# Patient Record
Sex: Male | Born: 1985 | Race: Black or African American | Hispanic: No | Marital: Single | Smoking: Current every day smoker
Health system: Southern US, Community
[De-identification: ages and names within clinical notes are randomized; demographics above are authoritative.]

## PROBLEM LIST (undated history)

## (undated) HISTORY — PX: APPENDECTOMY: SHX54

---

## 2008-09-19 ENCOUNTER — Emergency Department (HOSPITAL_COMMUNITY): Admission: EM | Admit: 2008-09-19 | Discharge: 2008-09-20 | Payer: Self-pay | Admitting: Emergency Medicine

## 2010-08-13 LAB — POCT I-STAT, CHEM 8
BUN: 13 mg/dL (ref 6–23)
Chloride: 103 mEq/L (ref 96–112)
Creatinine, Ser: 1.1 mg/dL (ref 0.4–1.5)
Potassium: 3.6 mEq/L (ref 3.5–5.1)
Sodium: 140 mEq/L (ref 135–145)

## 2010-08-13 LAB — URINALYSIS, ROUTINE W REFLEX MICROSCOPIC
Glucose, UA: NEGATIVE mg/dL
Hgb urine dipstick: NEGATIVE
Specific Gravity, Urine: 1.026 (ref 1.005–1.030)

## 2010-08-18 ENCOUNTER — Emergency Department (HOSPITAL_COMMUNITY)
Admission: EM | Admit: 2010-08-18 | Discharge: 2010-08-18 | Disposition: A | Discharge status: Home / Self Care | Payer: Self-pay | Attending: Emergency Medicine | Admitting: Emergency Medicine

## 2010-08-18 DIAGNOSIS — H9209 Otalgia, unspecified ear: Secondary | ICD-10-CM | POA: Insufficient documentation

## 2010-08-18 DIAGNOSIS — H6690 Otitis media, unspecified, unspecified ear: Secondary | ICD-10-CM | POA: Insufficient documentation

## 2010-10-23 IMAGING — CR DG CHEST 2V
2 series · 2 of 2 positions shown · non-contrast
Comparison: None

CLINICAL DATA: Chest pain

CHEST - 2 VIEW

[w chest pa]
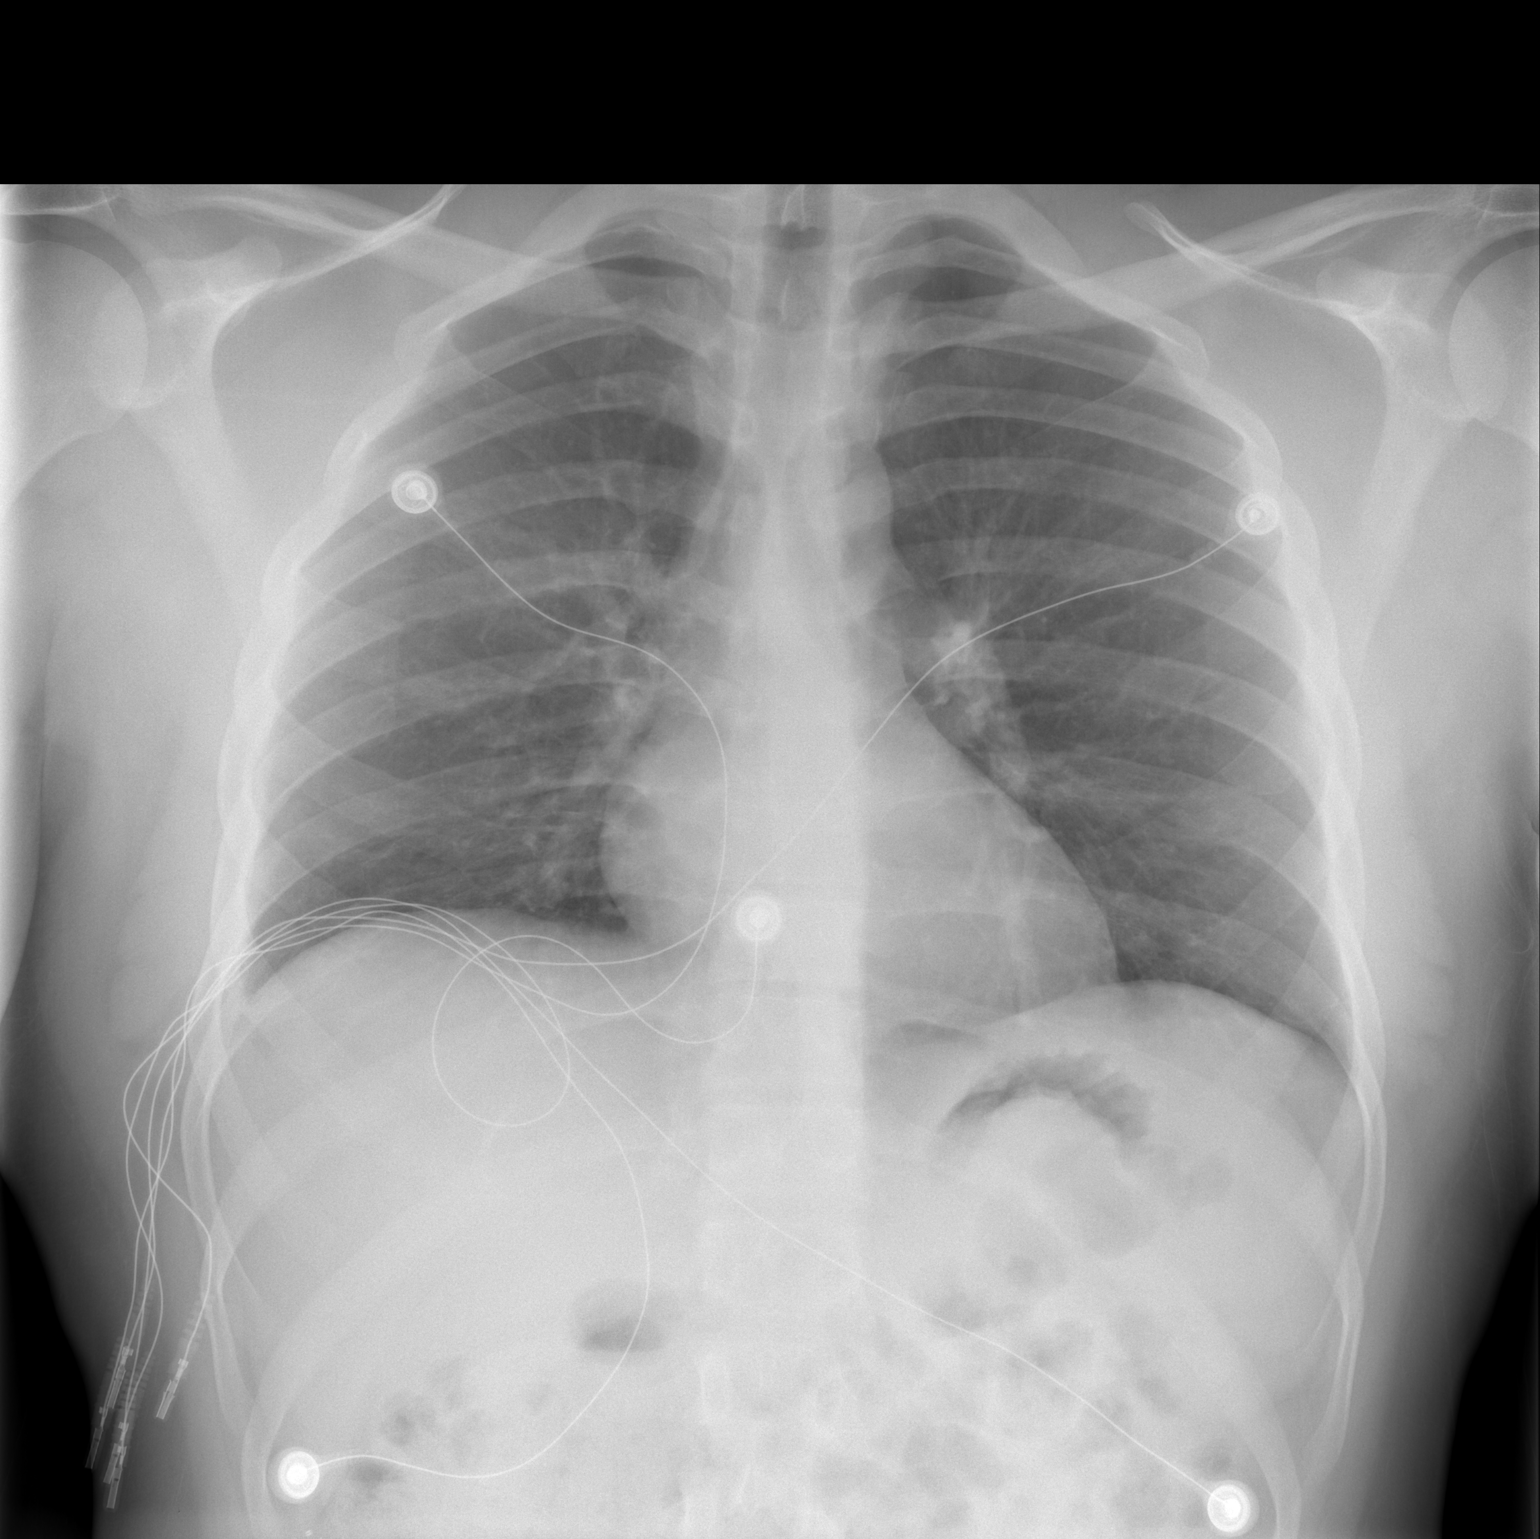

[w chest lat]
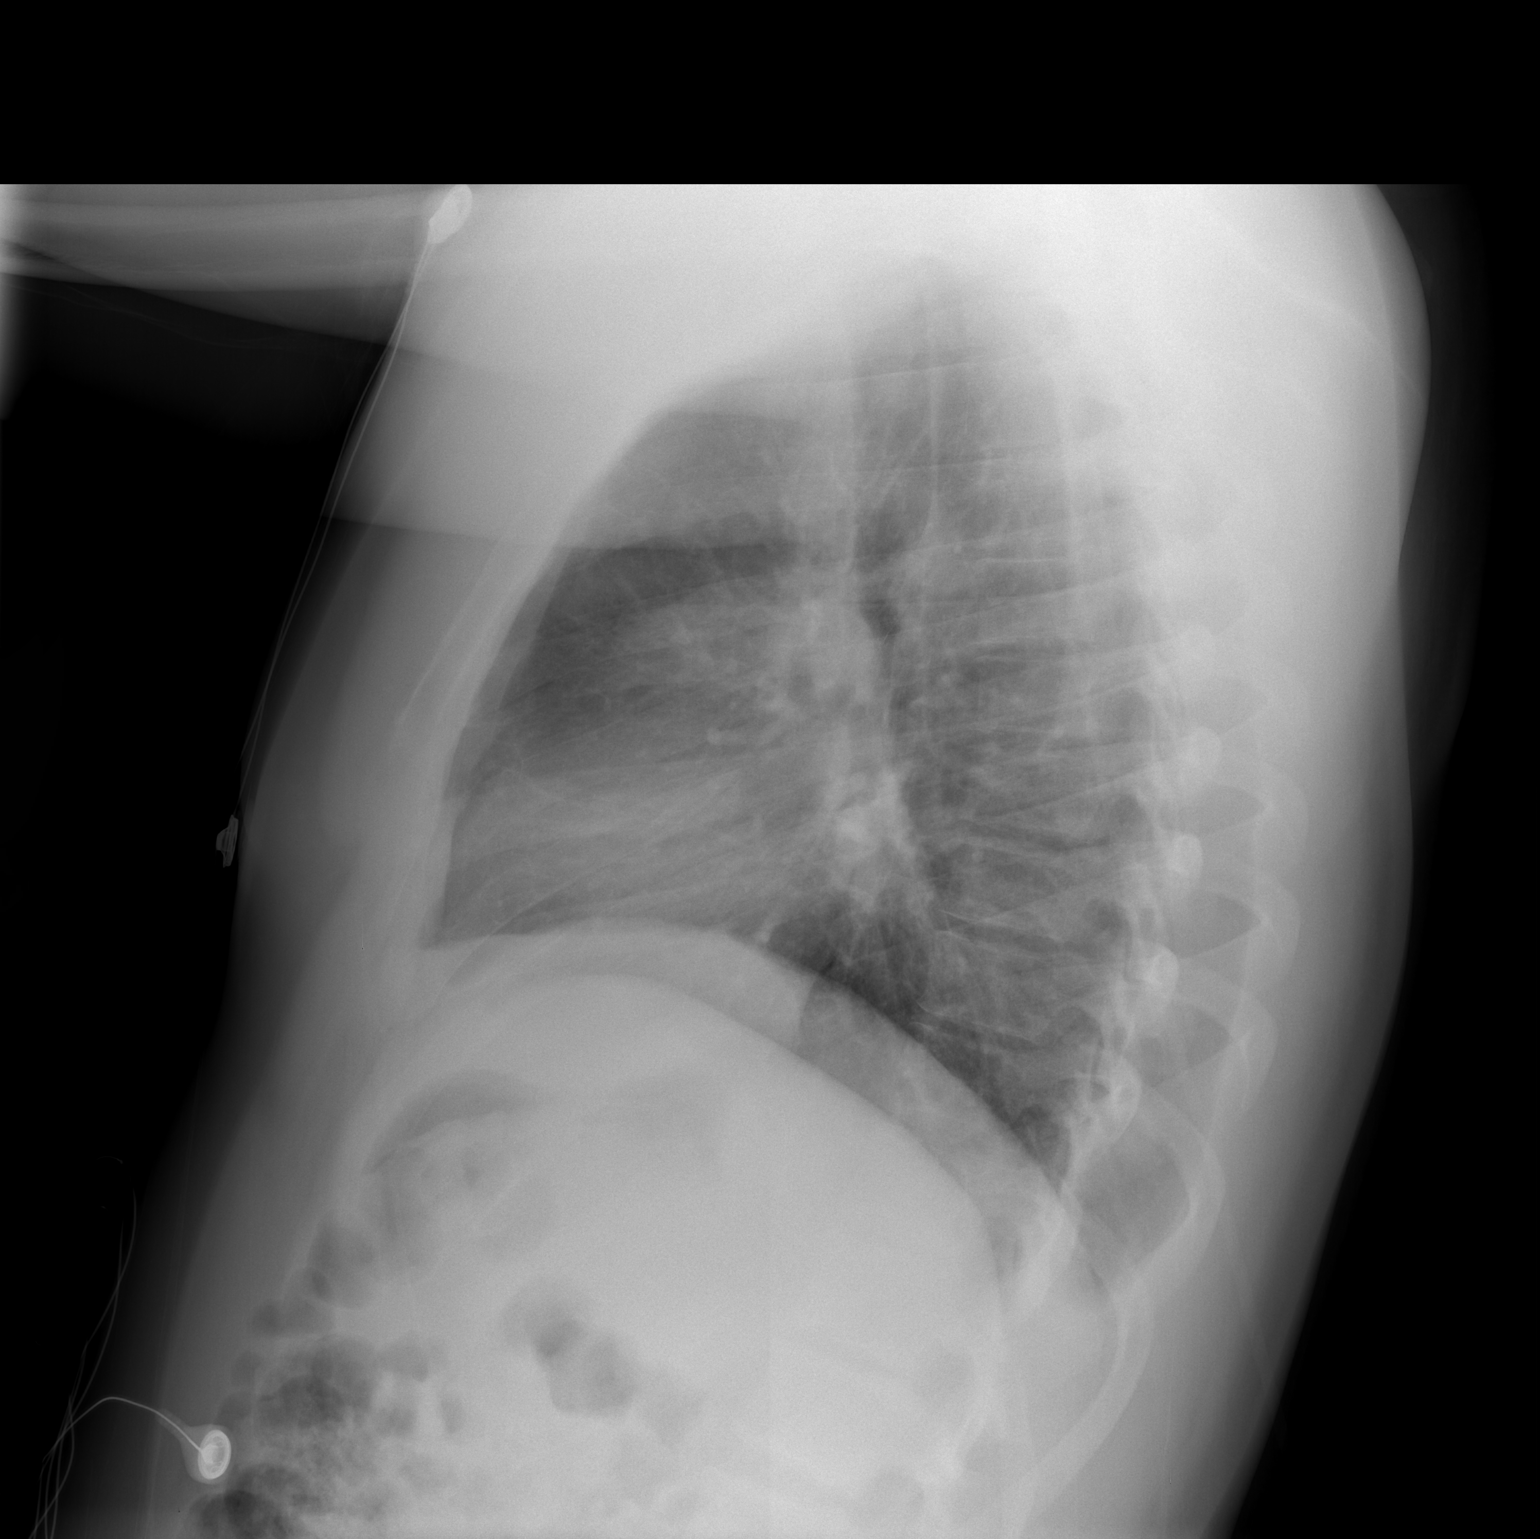

[2 of 2 positions shown; findings below may reference images not displayed]

FINDINGS: Minimal atelectasis present at both lung bases.  No edema
or infiltrates.  The heart size is normal.  No pleural effusions.
IMPRESSION: No active disease.  Minimal bibasilar atelectasis.

## 2015-07-25 ENCOUNTER — Emergency Department (HOSPITAL_COMMUNITY)
Admission: EM | Admit: 2015-07-25 | Discharge: 2015-07-25 | Disposition: A | Discharge status: Home / Self Care | Payer: Self-pay | Attending: Emergency Medicine | Admitting: Emergency Medicine

## 2015-07-25 ENCOUNTER — Encounter (HOSPITAL_COMMUNITY): Payer: Self-pay | Admitting: Emergency Medicine

## 2015-07-25 DIAGNOSIS — F17200 Nicotine dependence, unspecified, uncomplicated: Secondary | ICD-10-CM | POA: Insufficient documentation

## 2015-07-25 DIAGNOSIS — M109 Gout, unspecified: Secondary | ICD-10-CM

## 2015-07-25 DIAGNOSIS — M10072 Idiopathic gout, left ankle and foot: Secondary | ICD-10-CM | POA: Insufficient documentation

## 2015-07-25 MED ORDER — PREDNISONE 10 MG PO TABS: 20.0000 mg | ORAL_TABLET | Freq: Every day | ORAL | Status: AC

## 2015-07-25 MED ORDER — PREDNISONE 20 MG PO TABS
60.0000 mg | ORAL_TABLET | Freq: Once | ORAL | Status: AC
  Administered 2015-07-25: 60 mg via ORAL
  Filled 2015-07-25: qty 3

## 2015-07-25 MED ORDER — TRAMADOL HCL 50 MG PO TABS: 50.0000 mg | ORAL_TABLET | Freq: Once | ORAL | Status: AC

## 2015-07-25 MED ORDER — TRAMADOL HCL 50 MG PO TABS
50.0000 mg | ORAL_TABLET | Freq: Once | ORAL | Status: AC
  Administered 2015-07-25: 50 mg via ORAL
  Filled 2015-07-25: qty 1

## 2015-07-25 NOTE — Discharge Instructions (Signed)
Gout °Gout is when your joints become red, sore, and swell (inflamed). This is caused by the buildup of uric acid crystals in the joints. Uric acid is a chemical that is normally in the blood. If the level of uric acid gets too high in the blood, these crystals form in your joints and tissues. Over time, these crystals can form into masses near the joints and tissues. These masses can destroy bone and cause the bone to look misshapen (deformed). °HOME CARE  °· Do not take aspirin for pain. °· Only take medicine as told by your doctor. °· Rest the joint as much as you can. When in bed, keep sheets and blankets off painful areas. °· Keep the sore joints raised (elevated). °· Put warm or cold packs on painful joints. Use of warm or cold packs depends on which works best for you. °· Use crutches if the painful joint is in your leg. °· Drink enough fluids to keep your pee (urine) clear or pale yellow. Limit alcohol, sugary drinks, and drinks with fructose in them. °· Follow your diet instructions. Pay careful attention to how much protein you eat. Include fruits, vegetables, whole grains, and fat-free or low-fat milk products in your daily diet. Talk to your doctor or dietitian about the use of coffee, vitamin C, and cherries. These may help lower uric acid levels. °· Keep a healthy body weight. °GET HELP RIGHT AWAY IF:  °· You have watery poop (diarrhea), throw up (vomit), or have any side effects from medicines. °· You do not feel better in 24 hours, or you are getting worse. °· Your joint becomes suddenly more tender, and you have chills or a fever. °MAKE SURE YOU:  °· Understand these instructions. °· Will watch your condition. °· Will get help right away if you are not doing well or get worse. °  °This information is not intended to replace advice given to you by your health care provider. Make sure you discuss any questions you have with your health care provider. °  °Document Released: 01/29/2008 Document Revised:  05/12/2014 Document Reviewed: 12/03/2011 °Elsevier Interactive Patient Education ©2016 [employer] ° °

## 2015-07-25 NOTE — ED Notes (Signed)
Pt states that his L foot has hurt since this morning and feels stiff. Familial hx of gout but no personal hx. Denies injury. Alert and oriented.

## 2015-07-25 NOTE — ED Provider Notes (Signed)
CSN: [id number]     Arrival date & time 07/25/15  2056 History  By signing my name below, I, Rodney Reilly, attest that this documentation has been prepared under the direction and in the presence of Arman FilterGail K. Rilan Eiland, NP. Electronically Signed: Budd PalmerVanessa Reilly, ED Scribe. 07/25/2015. 10:13 PM.    Chief Complaint  Patient presents with  . Foot Pain   The history is provided by the patient. No language interpreter was used.   HPI Comments: Stevenson ClinchMarcus Bones is a 30 y.o. male smoker who presents to the Emergency Department complaining of left great toe pain onset this morning. He reports associated erythema and swelling. He notes exacerbation of the pain with movement of the toe. He has not taken anything for this. He reports a FHx of gout, but denies a personal hx.    History reviewed. No pertinent past medical history. Past Surgical History  Procedure Laterality Date  . Appendectomy     History reviewed. No pertinent family history. Social History  Substance Use Topics  . Smoking status: Current Every Day Smoker  . Smokeless tobacco: None  . Alcohol Use: No    Review of Systems  Constitutional: Negative for fever.  Musculoskeletal: Positive for joint swelling and arthralgias.  Skin: Positive for color change.  All other systems reviewed and are negative.   Allergies  Morphine and related and Sulfa antibiotics  Home Medications   Prior to Admission medications   Medication Sig Start Date End Date Taking? Authorizing Provider  predniSONE (DELTASONE) 10 MG tablet Take 2 tablets (20 mg total) by mouth daily. 07/25/15   Earley FavorGail Valin Massie, NP  traMADol (ULTRAM) 50 MG tablet Take 1 tablet (50 mg total) by mouth once. 07/25/15   Earley FavorGail Pharaoh Pio, NP   BP 159/100 mmHg  Pulse 108  Temp(Src) 98.2 F (36.8 C) (Oral)  Resp 18  SpO2 99% Physical Exam  Constitutional: He is oriented to person, place, and time. He appears well-developed and well-nourished.  HENT:  Head: Normocephalic and atraumatic.   Eyes: Conjunctivae are normal. Right eye exhibits no discharge. Left eye exhibits no discharge.  Pulmonary/Chest: Effort normal. No respiratory distress.  Musculoskeletal: He exhibits tenderness. He exhibits no edema.  Redness and swelling to the MCP joint of the left great toe  Neurological: He is alert and oriented to person, place, and time. Coordination normal.  Skin: Skin is warm and dry. No rash noted. He is not diaphoretic. There is erythema.  Psychiatric: He has a normal mood and affect.  Nursing note and vitals reviewed.   ED Course  Procedures  DIAGNOSTIC STUDIES: Oxygen Saturation is 99% on RA, normal by my interpretation.    COORDINATION OF CARE: 10:04 PM - Discussed probable gout and plans to order steroids and pain medication. Pt advised of plan for treatment and pt agrees.  Labs Review Labs Reviewed - No data to display  Imaging Review No results found. I have personally reviewed and evaluated these images and lab results as part of my medical decision-making.   EKG Interpretation None     Will Rx Prednisone and Ultram  MDM   Final diagnoses:  Acute gout of left foot, unspecified cause    I personally performed the services described in this documentation, which was scribed in my presence. The recorded information has been reviewed and is accurate.  Earley FavorGail Verania Salberg, NP 07/25/15 2221  Melene Planan Floyd, DO 07/26/15 [phone]

## 2023-02-08 ENCOUNTER — Emergency Department (HOSPITAL_COMMUNITY): Payer: Self-pay

## 2023-02-08 ENCOUNTER — Other Ambulatory Visit: Payer: Self-pay

## 2023-02-08 ENCOUNTER — Emergency Department (HOSPITAL_COMMUNITY)
Admission: EM | Admit: 2023-02-08 | Discharge: 2023-02-09 | Disposition: A | Discharge status: Home / Self Care | Payer: Self-pay | Attending: Emergency Medicine | Admitting: Emergency Medicine

## 2023-02-08 ENCOUNTER — Encounter (HOSPITAL_COMMUNITY): Payer: Self-pay | Admitting: *Deleted

## 2023-02-08 DIAGNOSIS — Z1152 Encounter for screening for COVID-19: Secondary | ICD-10-CM | POA: Insufficient documentation

## 2023-02-08 DIAGNOSIS — R0789 Other chest pain: Secondary | ICD-10-CM

## 2023-02-08 DIAGNOSIS — J189 Pneumonia, unspecified organism: Secondary | ICD-10-CM | POA: Insufficient documentation

## 2023-02-08 DIAGNOSIS — Z7289 Other problems related to lifestyle: Secondary | ICD-10-CM

## 2023-02-08 LAB — CBC
HCT: 46.9 % (ref 39.0–52.0)
Hemoglobin: 15 g/dL (ref 13.0–17.0)
MCH: 25.7 pg — ABNORMAL LOW (ref 26.0–34.0)
MCHC: 32 g/dL (ref 30.0–36.0)
MCV: 80.3 fL (ref 80.0–100.0)
Platelets: 320 10*3/uL (ref 150–400)
RBC: 5.84 MIL/uL — ABNORMAL HIGH (ref 4.22–5.81)
RDW: 13.6 % (ref 11.5–15.5)
WBC: 13.9 10*3/uL — ABNORMAL HIGH (ref 4.0–10.5)
nRBC: 0 % (ref 0.0–0.2)

## 2023-02-08 NOTE — ED Triage Notes (Signed)
The pt is c/o chest pain and sob for 2-3 days  no temp  no previous history

## 2023-02-09 ENCOUNTER — Other Ambulatory Visit (HOSPITAL_COMMUNITY): Payer: Self-pay

## 2023-02-09 LAB — BASIC METABOLIC PANEL
Anion gap: 13 (ref 5–15)
BUN: 16 mg/dL (ref 6–20)
CO2: 26 mmol/L (ref 22–32)
Calcium: 8.7 mg/dL — ABNORMAL LOW (ref 8.9–10.3)
Chloride: 99 mmol/L (ref 98–111)
Creatinine, Ser: 1.28 mg/dL — ABNORMAL HIGH (ref 0.61–1.24)
GFR, Estimated: >60 mL/min (ref >60)
Glucose, Bld: 89 mg/dL (ref 70–99)
Potassium: 3.9 mmol/L (ref 3.5–5.1)
Sodium: 138 mmol/L (ref 135–145)

## 2023-02-09 LAB — TROPONIN I (HIGH SENSITIVITY)
Troponin I (High Sensitivity): 8 ng/L (ref <18)
Troponin I (High Sensitivity): 8 ng/L (ref <18)

## 2023-02-09 LAB — SARS CORONAVIRUS 2 BY RT PCR: SARS Coronavirus 2 by RT PCR: NEGATIVE

## 2023-02-09 MED ORDER — AZITHROMYCIN 250 MG PO TABS
250.0000 mg | ORAL_TABLET | Freq: Every day | ORAL | 0 refills | Status: AC
  Filled 2023-02-09: qty 6, 5d supply, fill #0

## 2023-02-09 MED ORDER — NAPROXEN 250 MG PO TABS
500.0000 mg | ORAL_TABLET | Freq: Once | ORAL | Status: AC
  Administered 2023-02-09: 500 mg via ORAL
  Filled 2023-02-09: qty 2

## 2023-02-09 MED ORDER — ALBUTEROL SULFATE HFA 108 (90 BASE) MCG/ACT IN AERS
1.0000 | INHALATION_SPRAY | Freq: Four times a day (QID) | RESPIRATORY_TRACT | 0 refills | Status: AC | PRN
  Filled 2023-02-09: qty 6.7, 25d supply, fill #0

## 2023-02-09 MED ORDER — NAPROXEN 500 MG PO TABS
500.0000 mg | ORAL_TABLET | Freq: Two times a day (BID) | ORAL | 0 refills | Status: AC
  Filled 2023-02-09: qty 10, 5d supply, fill #0

## 2023-02-09 NOTE — Discharge Instructions (Signed)
We suspect right now that you are possibly having walking pneumonia and musculoskeletal pain. Your cardiac workup is normal. As discussed, we have low suspicion for blood clots in the lungs, but if your symptoms of chest pain, shortness of breath get worse or you start having near fainting, fainting, bloody phlegm then please return to the ER for further reassessment.  Please discontinue vaping as soon as you can.

## 2023-02-09 NOTE — ED Notes (Signed)
TOC meds given to patient

## 2023-02-09 NOTE — ED Provider Notes (Addendum)
[place] EMERGENCY DEPARTMENT AT [facility] Provider Note   CSN: [id number] Arrival date & time: 02/08/23  2238     History  Chief Complaint  Patient presents with   Chest Pain    Rodney Reilly is a 37 y.o. male.  HPI    37 y/o M comes in with cc of chest pain. Chest pain started 3 days ago, it is center and left and also in his back. Pain is constant since yesterday and it is pressure like and sharp, and no hx of similar pain. Pain worse with walking, but also with pushing and pulling but there is no injury. Pt also has cough and shob, pain worse with cough and deep inspiration. Cough x 3 days, sometimes producing. He didn't think he had fever, but febrile at arrival.  Pt has no hx of PE, DVT and denies any exogenous hormone (testosterone / estrogen) use, long distance travels or surgery in the past 6 weeks, active cancer, recent immobilization.  Family hx - + CAD, but at old age. No clotting hx. Social hx - Vapes all day   Home Medications Prior to Admission medications   Medication Sig Start Date End Date Taking? Authorizing Provider  predniSONE (DELTASONE) 10 MG tablet Take 2 tablets (20 mg total) by mouth daily. 07/25/15   Earley Favor, NP  traMADol (ULTRAM) 50 MG tablet Take 1 tablet (50 mg total) by mouth once. 07/25/15   Earley Favor, NP      Allergies    Morphine and codeine and Sulfa antibiotics    Review of Systems   Review of Systems  All other systems reviewed and are negative.   Physical Exam Updated Vital Signs BP 119/67   Pulse 68   Temp 98.1 F (36.7 C) (Oral)   Resp (!) 22   Ht 5\' 11"  (1.803 m)   Wt 127 kg   SpO2 100%   BMI 39.05 kg/m  Physical Exam Vitals and nursing note reviewed.  Constitutional:      Appearance: He is well-developed.  HENT:     Head: Atraumatic.  Cardiovascular:     Rate and Rhythm: Normal rate.     Heart sounds: Normal heart sounds.  Pulmonary:     Effort: Pulmonary effort is normal.  Musculoskeletal:      Cervical back: Neck supple.     Comments: Patient does have reproducible component to the chest pain in the back region  Skin:    General: Skin is warm.  Neurological:     Mental Status: He is alert and oriented to person, place, and time.     ED Results / Procedures / Treatments   Labs (all labs ordered are listed, but only abnormal results are displayed) Labs Reviewed  BASIC METABOLIC PANEL - Abnormal; Notable for the following components:      Result Value   Creatinine, Ser 1.28 (*)    Calcium 8.7 (*)    All other components within normal limits  CBC - Abnormal; Notable for the following components:   WBC 13.9 (*)    RBC 5.84 (*)    MCH 25.7 (*)    All other components within normal limits  SARS CORONAVIRUS 2 BY RT PCR  TROPONIN I (HIGH SENSITIVITY)  TROPONIN I (HIGH SENSITIVITY)    EKG EKG Interpretation Date/Time:  Sunday February 08 2023 22:52:23 EDT Ventricular Rate:  100 PR Interval:  124 QRS Duration:  74 QT Interval:  332 QTC Calculation: 428 R Axis:  54  Text Interpretation: Normal sinus rhythm Normal ECG When compared with ECG of 19-Sep-2008 23:59, PREVIOUS ECG IS PRESENT No acute changes No significant change since last tracing Confirmed by Derwood Kaplan [phone]) on 02/09/2023 9:48:12 AM  Radiology DG Chest 2 View  Result Date: 02/09/2023 CLINICAL DATA:  Chest pain for 2-3 days with shortness of breath. Pain worse with movement EXAM: CHEST - 2 VIEW COMPARISON:  09/20/2008 FINDINGS: The heart size and mediastinal contours are within normal limits. Bibasilar atelectasis/scarring. Otherwise no focal consolidation, pleural effusion, or pneumothorax. No displaced rib fractures. IMPRESSION: No active cardiopulmonary disease. Electronically Signed   By: Minerva Fester M.D.   On: 02/09/2023 01:02    Procedures Procedures    Medications Ordered in ED Medications - No data to display  ED Course/ Medical Decision Making/ A&P             HEART Score: 1                     Medical Decision Making  This patient presents to the ED with chief complaint(s) of left-sided chest pain, back pain, cough and he was noted to have fevers with pertinent past medical history of heavy vaping.The complaint involves an extensive differential diagnosis and also carries with it a high risk of complications and morbidity.    The differential diagnosis includes : Pulmonary embolism, acute coronary syndrome, pneumonia, pleurisy, chest wall pain, musculoskeletal pain  The initial plan is to get delta troponin.  Patient is PERC negative, he does not have any PE risk factors, no signs of DVT and patient amenable to returning to the ER if his symptoms get worse.    Independent labs interpretation:  The following labs were independently interpreted: Patient's delta troponins are normal.  Patient has mild leukocytosis and was febrile at arrival. No hypoxia, no tachycardia in the ED.  Independent visualization and interpretation of imaging: - I independently visualized the following imaging with scope of interpretation limited to determining acute life threatening conditions related to emergency care: X-ray of the chest, which revealed no evidence of focal consolidation, pleural effusion, pulmonary edema.  Treatment and Reassessment: Results of the ED workup discussed with the patient. Discussed with him that 1 consideration was PE, but he has no risk factors for it and using decision-making tool it is highly unlikely that he has PE.  He is comfortable returning to the ER if his symptoms are not improving despite medications we prescribed.  He does have reproducible chest wall discomfort, which could explain why there is a pleuritic component to the pain.  He feels like there is some tightness to his chest and he was febrile with elevated white count, will treat this like a walking pneumonia.  Delta troponin are normal.  Low concerns clinically for ACS as the cause.  No  indication for cardiology follow-up at this time.  TOC consulted for help with medications.   Social Determinants of health: Lack of insurance.   Final Clinical Impression(s) / ED Diagnoses Final diagnoses:  Community acquired pneumonia of left lung, unspecified part of lung  Atypical chest pain    Rx / DC Orders ED Discharge Orders     None         Derwood Kaplan, MD 02/09/23 1033    Derwood Kaplan, MD 02/09/23 1034

## 2023-10-19 ENCOUNTER — Other Ambulatory Visit: Payer: Self-pay

## 2024-05-30 ENCOUNTER — Emergency Department (HOSPITAL_COMMUNITY): Payer: Self-pay

## 2024-05-30 ENCOUNTER — Encounter (HOSPITAL_COMMUNITY): Payer: Self-pay

## 2024-05-30 ENCOUNTER — Emergency Department (HOSPITAL_COMMUNITY)
Admission: EM | Admit: 2024-05-30 | Discharge: 2024-05-30 | Disposition: A | Discharge status: Home / Self Care | Payer: Self-pay

## 2024-05-30 ENCOUNTER — Other Ambulatory Visit: Payer: Self-pay

## 2024-05-30 DIAGNOSIS — M546 Pain in thoracic spine: Secondary | ICD-10-CM | POA: Insufficient documentation

## 2024-05-30 DIAGNOSIS — R0789 Other chest pain: Secondary | ICD-10-CM | POA: Insufficient documentation

## 2024-05-30 DIAGNOSIS — D72829 Elevated white blood cell count, unspecified: Secondary | ICD-10-CM | POA: Insufficient documentation

## 2024-05-30 DIAGNOSIS — R079 Chest pain, unspecified: Secondary | ICD-10-CM

## 2024-05-30 DIAGNOSIS — R000 Tachycardia, unspecified: Secondary | ICD-10-CM | POA: Insufficient documentation

## 2024-05-30 LAB — D-DIMER, QUANTITATIVE: D-Dimer, Quant: <0.27 ug{FEU}/mL (ref 0.00–0.50)

## 2024-05-30 LAB — BASIC METABOLIC PANEL WITH GFR
Anion gap: 11 (ref 5–15)
BUN: 12 mg/dL (ref 6–20)
CO2: 26 mmol/L (ref 22–32)
Calcium: 9.6 mg/dL (ref 8.9–10.3)
Chloride: 102 mmol/L (ref 98–111)
Creatinine, Ser: 1.19 mg/dL (ref 0.61–1.24)
GFR, Estimated: >60 mL/min
Glucose, Bld: 100 mg/dL — ABNORMAL HIGH (ref 70–99)
Potassium: 4.8 mmol/L (ref 3.5–5.1)
Sodium: 140 mmol/L (ref 135–145)

## 2024-05-30 LAB — CBC WITH DIFFERENTIAL/PLATELET
Abs Immature Granulocytes: 0.02 10*3/uL (ref 0.00–0.07)
Basophils Absolute: 0 10*3/uL (ref 0.0–0.1)
Basophils Relative: 0 %
Eosinophils Absolute: 0.1 10*3/uL (ref 0.0–0.5)
Eosinophils Relative: 1 %
HCT: 48.5 % (ref 39.0–52.0)
Hemoglobin: 15.7 g/dL (ref 13.0–17.0)
Immature Granulocytes: 0 %
Lymphocytes Relative: 20 %
Lymphs Abs: 2.2 10*3/uL (ref 0.7–4.0)
MCH: 26.1 pg (ref 26.0–34.0)
MCHC: 32.4 g/dL (ref 30.0–36.0)
MCV: 80.6 fL (ref 80.0–100.0)
Monocytes Absolute: 0.8 10*3/uL (ref 0.1–1.0)
Monocytes Relative: 7 %
Neutro Abs: 7.7 10*3/uL (ref 1.7–7.7)
Neutrophils Relative %: 72 %
Platelets: 296 10*3/uL (ref 150–400)
RBC: 6.02 MIL/uL — ABNORMAL HIGH (ref 4.22–5.81)
RDW: 13.2 % (ref 11.5–15.5)
WBC: 10.7 10*3/uL — ABNORMAL HIGH (ref 4.0–10.5)
nRBC: 0 % (ref 0.0–0.2)

## 2024-05-30 LAB — TROPONIN T, HIGH SENSITIVITY: Troponin T High Sensitivity: <6 ng/L (ref 0–19)

## 2024-05-30 MED ORDER — KETOROLAC TROMETHAMINE 15 MG/ML IJ SOLN
15.0000 mg | Freq: Once | INTRAMUSCULAR | Status: DC
  Filled 2024-05-30: qty 1

## 2024-05-30 MED ORDER — KETOROLAC TROMETHAMINE 30 MG/ML IJ SOLN
15.0000 mg | Freq: Once | INTRAMUSCULAR | Status: AC
  Administered 2024-05-30: 15 mg via INTRAVENOUS
  Filled 2024-05-30: qty 1

## 2024-05-30 NOTE — ED Notes (Signed)
Refuses EKG 

## 2024-05-30 NOTE — ED Provider Notes (Signed)
 "  EMERGENCY DEPARTMENT AT [facility] Provider Note   CSN: [id number] Arrival date & time: 05/30/24  1506     Patient presents with: Back Pain   Rodney Reilly is a 39 y.o. male.  Presents today for upper back and chest discomfort x 2 days.  Patient is a marine scientist.  Patient reports that the pain is worse with inspiration and movement.  Patient denies fever, chills, nausea, vomiting, cough, congestion, any other complaints at this time.    Back Pain      Prior to Admission medications  Medication Sig Start Date End Date Taking? Authorizing Provider  albuterol  (VENTOLIN  HFA) 108 (90 Base) MCG/ACT inhaler Inhale 1-2 puffs into the lungs every 6 (six) hours as needed for wheezing or shortness of breath. 02/09/23   Charlyn Sora, MD  azithromycin  (ZITHROMAX ) 250 MG tablet Take first 2 tablets together on day 1, then 1 every day until finished. 02/09/23   Charlyn Sora, MD  naproxen  (NAPROSYN ) 500 MG tablet Take 1 tablet (500 mg total) by mouth 2 (two) times daily. 02/09/23   Charlyn Sora, MD  predniSONE  (DELTASONE ) 10 MG tablet Take 2 tablets (20 mg total) by mouth daily. 07/25/15   Terryl Kubas, NP  traMADol  (ULTRAM ) 50 MG tablet Take 1 tablet (50 mg total) by mouth once. 07/25/15   Terryl Kubas, NP    Allergies: Morphine and codeine and Sulfa antibiotics    Review of Systems  Respiratory:  Positive for chest tightness and shortness of breath.   Musculoskeletal:  Positive for back pain.    Updated Vital Signs BP (!) 146/83 (BP Location: Left Arm)   Pulse 98   Temp 98.1 F (36.7 C) (Oral)   Resp 18   SpO2 97%   Physical Exam Vitals and nursing note reviewed.  Constitutional:      General: He is not in acute distress.    Appearance: He is well-developed. He is not toxic-appearing.  HENT:     Head: Normocephalic and atraumatic.     Right Ear: External ear normal.     Left Ear: External ear normal.  Eyes:     Conjunctiva/sclera:  Conjunctivae normal.  Cardiovascular:     Rate and Rhythm: Normal rate and regular rhythm.     Pulses: Normal pulses.     Heart sounds: Normal heart sounds. No murmur heard.    Comments: Patient borderline tachycardic Pulmonary:     Effort: Pulmonary effort is normal. No respiratory distress.     Breath sounds: Normal breath sounds.  Abdominal:     Palpations: Abdomen is soft.     Tenderness: There is no abdominal tenderness.  Musculoskeletal:        General: No swelling.     Cervical back: Neck supple.     Right lower leg: No edema.     Left lower leg: No edema.  Skin:    General: Skin is warm and dry.     Capillary Refill: Capillary refill takes less than 2 seconds.  Neurological:     General: No focal deficit present.     Mental Status: He is alert and oriented to person, place, and time.  Psychiatric:        Mood and Affect: Mood normal.     (all labs ordered are listed, but only abnormal results are displayed) Labs Reviewed  BASIC METABOLIC PANEL WITH GFR - Abnormal; Notable for the following components:      Result Value   Glucose,  Bld 100 (*)    All other components within normal limits  CBC WITH DIFFERENTIAL/PLATELET - Abnormal; Notable for the following components:   WBC 10.7 (*)    RBC 6.02 (*)    All other components within normal limits  D-DIMER, QUANTITATIVE  TROPONIN T, HIGH SENSITIVITY    EKG: None  Radiology: DG Chest 2 View Result Date: 05/30/2024 CLINICAL DATA:  Chest and back pain. EXAM: CHEST - 2 VIEW COMPARISON:  02/08/2023. FINDINGS: Trachea is midline. Heart size normal. Lungs are clear. No pleural fluid. IMPRESSION: Negative. Electronically Signed   By: Newell Eke M.D.   On: 05/30/2024 16:08     Procedures   Medications Ordered in the ED  ketorolac  (TORADOL ) 15 MG/ML injection 15 mg (has no administration in time range)                                    Medical Decision Making Amount and/or Complexity of Data  Reviewed Radiology: ordered.   This patient presents to the ED for concern of chest and back pain differential diagnosis includes STEMI, NSTEMI, arrhythmia, anemia, electrolyte abnormality, PE, musculoskeletal pain, costochondritis, pleurisy  Lab Tests:  I Ordered, and personally interpreted labs.  The pertinent results include: Mild leukocytosis at 10.7, negative D-dimer, troponin less than 6, BMP unremarkable   Imaging Studies ordered:  I ordered imaging studies including chest x-ray I independently visualized and interpreted imaging which showed no acute findings I agree with the radiologist interpretation   Medicines ordered and prescription drug management:  I ordered medication including toradol     I have reviewed the patients home medicines and have made adjustments as needed   Problem List / ED Course:  Considered EKG, however patient is refusing EKG at this time.  Considered for admission or further workup however patient's vital signs, physical exam, labs, and imaging are reassuring.  Considered possibility of STEMI, NSTEMI, and PE however patient's troponin D-dimer are negative and I am therefore less suspicious of this being the cause of his symptoms.  I suspect patient symptoms are musculoskeletal in nature.  Patient given Toradol  shot prior to discharge.  Patient given return precautions.  I feel patient is safe for discharge at this time.      Final diagnoses:  Chest pain, unspecified type    ED Discharge Orders     None          Francis Ileana SAILOR, PA-C 05/30/24 1809    Neysa Caron PARAS, DO 05/30/24 2221  "

## 2024-05-30 NOTE — Discharge Instructions (Addendum)
 Today you were seen for chest pain.  I suspect this is likely musculoskeletal in nature.  Please alternate Tylenol and Motrin as needed for pain.  Thank you for letting us  treat you today. After reviewing your labs and imaging, I feel you are safe to go home. Please follow up with your PCP in the next several days and provide them with your records from this visit. Return to the Emergency Room if pain becomes severe or symptoms worsen.

## 2024-05-30 NOTE — ED Triage Notes (Addendum)
 Pt reports with upper back and upper chest discomfort x 2 days. Pt states that he had pna last time this happened. Pt is denying pain or any other symptoms, he just wants to be checked out.
